# Patient Record
Sex: Female | Born: 1965 | Smoking: Never smoker
Health system: Southern US, Community
[De-identification: ages and names within clinical notes are randomized; demographics above are authoritative.]

## PROBLEM LIST (undated history)

## (undated) DIAGNOSIS — G4730 Sleep apnea, unspecified: Secondary | ICD-10-CM

## (undated) DIAGNOSIS — Z8489 Family history of other specified conditions: Secondary | ICD-10-CM

## (undated) DIAGNOSIS — R0600 Dyspnea, unspecified: Secondary | ICD-10-CM

## (undated) DIAGNOSIS — J45909 Unspecified asthma, uncomplicated: Secondary | ICD-10-CM

## (undated) DIAGNOSIS — K219 Gastro-esophageal reflux disease without esophagitis: Secondary | ICD-10-CM

## (undated) DIAGNOSIS — J189 Pneumonia, unspecified organism: Secondary | ICD-10-CM

## (undated) DIAGNOSIS — L4050 Arthropathic psoriasis, unspecified: Secondary | ICD-10-CM

## (undated) DIAGNOSIS — I219 Acute myocardial infarction, unspecified: Secondary | ICD-10-CM

## (undated) HISTORY — PX: CHOLECYSTECTOMY: SHX55

## (undated) HISTORY — PX: DIAGNOSTIC LAPAROSCOPY: SUR761

## (undated) HISTORY — PX: COLONOSCOPY: SHX174

## (undated) HISTORY — PX: ABDOMINAL HYSTERECTOMY: SHX81

---

## 2020-07-12 ENCOUNTER — Other Ambulatory Visit: Payer: Self-pay | Admitting: Otolaryngology

## 2020-08-16 ENCOUNTER — Encounter (HOSPITAL_BASED_OUTPATIENT_CLINIC_OR_DEPARTMENT_OTHER): Payer: Self-pay | Admitting: Otolaryngology

## 2020-08-16 ENCOUNTER — Other Ambulatory Visit: Payer: Self-pay

## 2020-08-19 ENCOUNTER — Other Ambulatory Visit (HOSPITAL_COMMUNITY): Payer: Self-pay

## 2020-08-20 ENCOUNTER — Other Ambulatory Visit (HOSPITAL_COMMUNITY)
Admission: RE | Admit: 2020-08-20 | Discharge: 2020-08-20 | Disposition: A | Discharge status: Home / Self Care | Payer: BC Managed Care – PPO | Source: Ambulatory Visit | Attending: Otolaryngology | Admitting: Otolaryngology

## 2020-08-20 DIAGNOSIS — Z20822 Contact with and (suspected) exposure to covid-19: Secondary | ICD-10-CM | POA: Diagnosis not present

## 2020-08-20 DIAGNOSIS — Z01812 Encounter for preprocedural laboratory examination: Secondary | ICD-10-CM | POA: Diagnosis present

## 2020-08-21 LAB — SARS CORONAVIRUS 2 (TAT 6-24 HRS): SARS Coronavirus 2: NEGATIVE

## 2020-08-23 ENCOUNTER — Ambulatory Visit (HOSPITAL_BASED_OUTPATIENT_CLINIC_OR_DEPARTMENT_OTHER): Payer: BC Managed Care – PPO | Admitting: Certified Registered"

## 2020-08-23 ENCOUNTER — Other Ambulatory Visit: Payer: Self-pay

## 2020-08-23 ENCOUNTER — Encounter (HOSPITAL_BASED_OUTPATIENT_CLINIC_OR_DEPARTMENT_OTHER): Payer: Self-pay | Admitting: Otolaryngology

## 2020-08-23 ENCOUNTER — Ambulatory Visit (HOSPITAL_BASED_OUTPATIENT_CLINIC_OR_DEPARTMENT_OTHER)
Admission: RE | Admit: 2020-08-23 | Discharge: 2020-08-23 | Disposition: A | Discharge status: Home / Self Care | Payer: BC Managed Care – PPO | Attending: Otolaryngology | Admitting: Otolaryngology

## 2020-08-23 ENCOUNTER — Encounter (HOSPITAL_BASED_OUTPATIENT_CLINIC_OR_DEPARTMENT_OTHER)
Admission: RE | Disposition: A | Discharge status: Home / Self Care | Payer: Self-pay | Source: Home / Self Care | Attending: Otolaryngology

## 2020-08-23 DIAGNOSIS — G4733 Obstructive sleep apnea (adult) (pediatric): Secondary | ICD-10-CM | POA: Diagnosis present

## 2020-08-23 DIAGNOSIS — Z881 Allergy status to other antibiotic agents status: Secondary | ICD-10-CM | POA: Insufficient documentation

## 2020-08-23 DIAGNOSIS — Z79899 Other long term (current) drug therapy: Secondary | ICD-10-CM | POA: Diagnosis not present

## 2020-08-23 DIAGNOSIS — Z7951 Long term (current) use of inhaled steroids: Secondary | ICD-10-CM | POA: Diagnosis not present

## 2020-08-23 HISTORY — DX: Unspecified asthma, uncomplicated: J45.909

## 2020-08-23 HISTORY — DX: Gastro-esophageal reflux disease without esophagitis: K21.9

## 2020-08-23 HISTORY — PX: DRUG INDUCED ENDOSCOPY: SHX6808

## 2020-08-23 HISTORY — DX: Arthropathic psoriasis, unspecified: L40.50

## 2020-08-23 HISTORY — DX: Sleep apnea, unspecified: G47.30

## 2020-08-23 SURGERY — DRUG INDUCED SLEEP ENDOSCOPY
Anesthesia: Monitor Anesthesia Care | Site: Mouth

## 2020-08-23 MED ORDER — PROPOFOL 500 MG/50ML IV EMUL
INTRAVENOUS | Status: DC | PRN
  Administered 2020-08-23: 35 ug/kg/min via INTRAVENOUS

## 2020-08-23 MED ORDER — LIDOCAINE 2% (20 MG/ML) 5 ML SYRINGE
INTRAMUSCULAR | Status: DC | PRN
  Administered 2020-08-23: 60 mg via INTRAVENOUS

## 2020-08-23 MED ORDER — LACTATED RINGERS IV SOLN: INTRAVENOUS | Status: DC

## 2020-08-23 MED ORDER — LIDOCAINE-EPINEPHRINE 1 %-1:100000 IJ SOLN
INTRAMUSCULAR | Status: AC
  Filled 2020-08-23: qty 1

## 2020-08-23 MED ORDER — ONDANSETRON HCL 4 MG/2ML IJ SOLN
INTRAMUSCULAR | Status: DC | PRN
  Administered 2020-08-23: 4 mg via INTRAVENOUS

## 2020-08-23 MED ORDER — ACETAMINOPHEN 500 MG PO TABS: 1000.0000 mg | ORAL_TABLET | Freq: Once | ORAL | Status: DC

## 2020-08-23 MED ORDER — FENTANYL CITRATE (PF) 100 MCG/2ML IJ SOLN: 25.0000 ug | INTRAMUSCULAR | Status: DC | PRN

## 2020-08-23 MED ORDER — OXYMETAZOLINE HCL 0.05 % NA SOLN
NASAL | Status: AC
  Filled 2020-08-23: qty 30

## 2020-08-23 SURGICAL SUPPLY — 15 items
CANISTER SUCT 1200ML W/VALVE (MISCELLANEOUS) IMPLANT
COVER WAND RF STERILE (DRAPES) IMPLANT
GLOVE BIOGEL M 7.0 STRL (GLOVE) ×3 IMPLANT
KIT CLEAN ENDO (MISCELLANEOUS) ×3 IMPLANT
NEEDLE PRECISIONGLIDE 27X1.5 (NEEDLE) IMPLANT
PACK BASIN DAY SURGERY FS (CUSTOM PROCEDURE TRAY) ×3 IMPLANT
PATTIES SURGICAL .5 X3 (DISPOSABLE) IMPLANT
SHEET MEDIUM DRAPE 40X70 STRL (DRAPES) ×3 IMPLANT
SOL ANTI FOG 6CC (MISCELLANEOUS) ×1 IMPLANT
SOLUTION ANTI FOG 6CC (MISCELLANEOUS) ×2
SPONGE NEURO XRAY DETECT 1X3 (DISPOSABLE) IMPLANT
SYR CONTROL 10ML LL (SYRINGE) IMPLANT
TOWEL GREEN STERILE FF (TOWEL DISPOSABLE) ×3 IMPLANT
TUBE CONNECTING 20'X1/4 (TUBING)
TUBE CONNECTING 20X1/4 (TUBING) IMPLANT

## 2020-08-23 NOTE — Op Note (Signed)
Operative Note: DRUG INDUCED SLEEP ENDOSCOPY  Patient: Jenna Baird  Medical record number: [id number]  Date:08/23/2020  Pre-operative Indications: 1.  Obstructive Sleep Apnea  Postoperative Indications: Same  Surgical Procedure: 1.  Drug Induced Sleep Endoscopy (DISE)  Anesthesia: MAC with IV sedation  Surgeon: Delsa Bern, M.D.  Complications: None  EBL: None  Findings: There is no evidence of complete concentric palatal obstruction.  Anatomically the patient should be a candidate for hypoglossal nerve stimulation therapy.     Brief History: The patient is a 54 y.o. female with a history of obstructive sleep apnea. The patient has undergone previous sleep study which showed mild to moderate levels of obstructive sleep apnea.  The patient was prescribed CPAP which they consistently attempted to use without success.  Given the patient's history and findings, the above drug-induced sleep endoscopy was recommended to assess the patient's anatomic level of apnea.  Risks and benefits were discussed in detail with the patient today understand and agree with our plan for surgery which is scheduled at [place] under sedated anesthesia as an outpatient.  Surgical Procedure: The patient is brought to the operating room on 08/23/2020 and placed in supine position on the operating table.  Intravenous sedated anesthesia was established without difficulty using the standard drug-induced sleep endoscopy protocol. When the patient was adequately anesthetized, surgical timeout was performed and correct identification of the patient and the surgical procedure.    A propofol infusion was administered and the patient was monitored carefully to achieve a level of sedation appropriate for DISE.  The patient did not respond to verbal commands but still had spontaneous respiration, sleep disordered breathing and associated desaturations were observed.  With the patient under adequate sedated anesthesia  the flexible nasal laryngoscope was passed without difficulty.  The patient's nasal cavity was patent without evidence of significant septal deviation, mass, polyp or obstruction.  The endoscope was then passed to visualize the velopharynx, oropharynx, tongue base and epiglottis to assess areas of obstruction.  Patient's airway showed anterior to posterior palatal and tongue base collapse.  Normal-appearing supraglottis and larynx..   There was no evidence of complete concentric palatal obstruction and the patient appeared to be a candidate anatomically for hypoglossal nerve stimulation therapy.  Surgical sponge count was correct. Patient was awakened from anesthetic and transferred from the operating room to the recovery room in stable condition. There were no complications and no blood loss.   Delsa Bern, M.D. [facility] 08/23/2020

## 2020-08-23 NOTE — Anesthesia Procedure Notes (Signed)
Procedure Name: MAC Date/Time: 08/23/2020 7:58 AM Performed by: Signe Colt, CRNA Pre-anesthesia Checklist: Patient identified, Emergency Drugs available, Suction available, Patient being monitored and Timeout performed Patient Re-evaluated:Patient Re-evaluated prior to induction Oxygen Delivery Method: Simple face mask

## 2020-08-23 NOTE — Transfer of Care (Signed)
Immediate Anesthesia Transfer of Care Note  Patient: Jenna Baird  Procedure(s) Performed: DRUG INDUCED ENDOSCOPY (N/A Mouth)  Patient Location: PACU  Anesthesia Type:MAC  Level of Consciousness: awake, alert , oriented and patient cooperative  Airway & Oxygen Therapy: Patient Spontanous Breathing and Patient connected to face mask oxygen  Post-op Assessment: Report given to RN and Post -op Vital signs reviewed and stable  Post vital signs: Reviewed and stable  Last Vitals:  Vitals Value Taken Time  BP 119/78 08/23/20 0802  Temp    Pulse 68 08/23/20 0803  Resp 23 08/23/20 0803  SpO2 100 % 08/23/20 0803  Vitals shown include unvalidated device data.  Last Pain:  Vitals:   08/23/20 0636  TempSrc: Oral  PainSc: 0-No pain         Complications: No complications documented.

## 2020-08-23 NOTE — Anesthesia Postprocedure Evaluation (Signed)
Anesthesia Post Note  Patient: Jenna Baird  Procedure(s) Performed: DRUG INDUCED ENDOSCOPY (N/A Mouth)     Patient location during evaluation: PACU Anesthesia Type: MAC Level of consciousness: awake and alert Pain management: pain level controlled Vital Signs Assessment: post-procedure vital signs reviewed and stable Respiratory status: spontaneous breathing, nonlabored ventilation, respiratory function stable and patient connected to nasal cannula oxygen Cardiovascular status: stable and blood pressure returned to baseline Postop Assessment: no apparent nausea or vomiting Anesthetic complications: no   No complications documented.  Last Vitals:  Vitals:   08/23/20 0801 08/23/20 0836  BP: 119/78 133/71  Pulse: 74 65  Resp: (!) 23 18  Temp: 36.7 C 36.7 C  SpO2: 99% 100%    Last Pain:  Vitals:   08/23/20 0836  TempSrc:   PainSc: 0-No pain                 Tiajuana Amass

## 2020-08-23 NOTE — Discharge Instructions (Signed)

## 2020-08-23 NOTE — Anesthesia Preprocedure Evaluation (Signed)
Anesthesia Evaluation  Patient identified by MRN, date of birth, ID band Patient awake    Reviewed: Allergy & Precautions, NPO status , Patient's Chart, lab work & pertinent test results  Airway Mallampati: II  TM Distance: >3 FB Neck ROM: Full    Dental  (+) Dental Advisory Given   Pulmonary asthma , sleep apnea ,    breath sounds clear to auscultation       Cardiovascular negative cardio ROS   Rhythm:Regular Rate:Normal     Neuro/Psych negative neurological ROS     GI/Hepatic Neg liver ROS, GERD  ,  Endo/Other  negative endocrine ROS  Renal/GU negative Renal ROS     Musculoskeletal  (+) Arthritis ,   Abdominal   Peds  Hematology negative hematology ROS (+)   Anesthesia Other Findings   Reproductive/Obstetrics                             Anesthesia Physical Anesthesia Plan  ASA: II  Anesthesia Plan: MAC   Post-op Pain Management:    Induction:   PONV Risk Score and Plan: 2 and Propofol infusion, Ondansetron and Treatment may vary due to age or medical condition  Airway Management Planned: Natural Airway, Simple Face Mask and Mask  Additional Equipment:   Intra-op Plan:   Post-operative Plan:   Informed Consent: I have reviewed the patients History and Physical, chart, labs and discussed the procedure including the risks, benefits and alternatives for the proposed anesthesia with the patient or authorized representative who has indicated his/her understanding and acceptance.     Dental advisory given  Plan Discussed with: CRNA  Anesthesia Plan Comments:         Anesthesia Quick Evaluation

## 2020-08-23 NOTE — H&P (Signed)
Shams Fill is an 54 y.o. female.   Chief Complaint: OSA HPI: hx of OSA unable to tol CPAP  Past Medical History:  Diagnosis Date  . Asthma   . GERD (gastroesophageal reflux disease)   . Psoriatic arthritis (HCC)   . Sleep apnea    does not use CPAP    Past Surgical History:  Procedure Laterality Date  . ABDOMINAL HYSTERECTOMY    . CHOLECYSTECTOMY    . COLONOSCOPY    . DIAGNOSTIC LAPAROSCOPY      History reviewed. No pertinent family history. Social History:  reports that she has never smoked. She has never used smokeless tobacco. She reports current alcohol use. She reports that she does not use drugs.  Allergies:  Allergies  Allergen Reactions  . Vancomycin Hives    Medications Prior to Admission  Medication Sig Dispense Refill  . Esomeprazole Magnesium (NEXIUM 24HR PO) Take by mouth.    . etanercept (ENBREL SURECLICK) 50 MG/ML injection Inject 50 mg into the skin once a week.    . fluticasone furoate-vilanterol (BREO ELLIPTA) 200-25 MCG/INH AEPB Inhale 1 puff into the lungs daily.    Marland Kitchen levalbuterol (XOPENEX HFA) 45 MCG/ACT inhaler Inhale 1 puff into the lungs every 6 (six) hours as needed for wheezing.    . montelukast (SINGULAIR) 10 MG tablet Take 10 mg by mouth at bedtime.    . Multiple Vitamin (MULTIVITAMIN WITH MINERALS) TABS tablet Take 1 tablet by mouth daily.      No results found for this or any previous visit (from the past 48 hour(s)). No results found.  Review of Systems  Constitutional: Negative.   HENT: Negative.   Respiratory: Positive for apnea.     Pulse 70, temperature 98 F (36.7 C), temperature source Oral, resp. rate 16, height 5\' 4"  (1.626 m), weight 82.8 kg, SpO2 99 %. Physical Exam Constitutional:      Appearance: She is normal weight.  Cardiovascular:     Rate and Rhythm: Normal rate.  Pulmonary:     Effort: Pulmonary effort is normal.  Neurological:     Mental Status: She is alert.      Assessment/Plan Adm for OP drug  induced sleep endoscopy  , MD 08/23/2020, 7:40 AM

## 2020-08-24 ENCOUNTER — Encounter (HOSPITAL_BASED_OUTPATIENT_CLINIC_OR_DEPARTMENT_OTHER): Payer: Self-pay | Admitting: Otolaryngology

## 2020-11-18 ENCOUNTER — Other Ambulatory Visit: Payer: Self-pay | Admitting: Otolaryngology

## 2021-01-12 ENCOUNTER — Other Ambulatory Visit: Payer: Self-pay

## 2021-01-12 ENCOUNTER — Encounter (HOSPITAL_COMMUNITY): Payer: Self-pay

## 2021-01-12 ENCOUNTER — Encounter (HOSPITAL_COMMUNITY)
Admission: RE | Admit: 2021-01-12 | Discharge: 2021-01-12 | Disposition: A | Discharge status: Home / Self Care | Payer: BC Managed Care – PPO | Source: Ambulatory Visit | Attending: Otolaryngology | Admitting: Otolaryngology

## 2021-01-12 DIAGNOSIS — Z20822 Contact with and (suspected) exposure to covid-19: Secondary | ICD-10-CM | POA: Insufficient documentation

## 2021-01-12 DIAGNOSIS — E663 Overweight: Secondary | ICD-10-CM | POA: Diagnosis not present

## 2021-01-12 DIAGNOSIS — G4733 Obstructive sleep apnea (adult) (pediatric): Secondary | ICD-10-CM | POA: Diagnosis not present

## 2021-01-12 DIAGNOSIS — Z6830 Body mass index (BMI) 30.0-30.9, adult: Secondary | ICD-10-CM | POA: Diagnosis not present

## 2021-01-12 DIAGNOSIS — Z01812 Encounter for preprocedural laboratory examination: Secondary | ICD-10-CM | POA: Diagnosis not present

## 2021-01-12 HISTORY — DX: Dyspnea, unspecified: R06.00

## 2021-01-12 HISTORY — DX: Acute myocardial infarction, unspecified: I21.9

## 2021-01-12 HISTORY — DX: Family history of other specified conditions: Z84.89

## 2021-01-12 HISTORY — DX: Pneumonia, unspecified organism: J18.9

## 2021-01-12 LAB — CBC
HCT: 43.3 % (ref 36.0–46.0)
Hemoglobin: 13.3 g/dL (ref 12.0–15.0)
MCH: 24.8 pg — ABNORMAL LOW (ref 26.0–34.0)
MCHC: 30.7 g/dL (ref 30.0–36.0)
MCV: 80.6 fL (ref 80.0–100.0)
Platelets: 327 10*3/uL (ref 150–400)
RBC: 5.37 MIL/uL — ABNORMAL HIGH (ref 3.87–5.11)
RDW: 14.3 % (ref 11.5–15.5)
WBC: 8.7 10*3/uL (ref 4.0–10.5)
nRBC: 0 % (ref 0.0–0.2)

## 2021-01-12 LAB — SARS CORONAVIRUS 2 (TAT 6-24 HRS): SARS Coronavirus 2: NEGATIVE

## 2021-01-12 NOTE — Progress Notes (Signed)
Surgical Instructions   Your procedure is scheduled on Friday, March 25th.  Report to [facility] Main Entrance "A" at 5:30 A.M., then check in with the Admitting office.  Call this number if you have problems the morning of surgery:  [phone]   If you have any questions prior to your surgery date call [phone]: Open Monday-Friday 8am-4pm   Remember:  Do not eat or drink after midnight the night before your surgery    Take these medicines the morning of surgery with A SIP OF WATER  esomeprazole (NEXIUM) loratadine (CLARITIN)   If needed: levalbuterol (XOPENEX HFA)/inhaler  As of today, STOP taking any Aspirin (unless otherwise instructed by your surgeon) Aleve, Naproxen, Ibuprofen, Motrin, Advil, Goody's, BC's, all herbal medications, fish oil, and all vitamins.                     Do not wear jewelry, make up, or nail polish            Do not wear lotions, powders, perfumes, or deodorant.            Do not shave 48 hours prior to surgery.              Do not bring valuables to the hospital.            [facility] is not responsible for any belongings or valuables.  Do NOT Smoke (Tobacco/Vaping) or drink Alcohol 24 hours prior to your procedure If you use a CPAP at night, you may bring all equipment for your overnight stay.   Contacts, glasses, dentures or bridgework may not be worn into surgery, please bring cases for these belongings   For patients admitted to the hospital, discharge time will be determined by your treatment team.   Patients discharged the day of surgery will not be allowed to drive home, and someone needs to stay with them for 24 hours.  Special instructions:   [place]- Preparing For Surgery  Before surgery, you can play an important role. Because skin is not sterile, your skin needs to be as free of germs as possible. You can reduce the number of germs on your skin by washing with CHG (chlorahexidine gluconate) Soap before surgery.  CHG is an  antiseptic cleaner which kills germs and bonds with the skin to continue killing germs even after washing.    Oral Hygiene is also important to reduce your risk of infection.  Remember - BRUSH YOUR TEETH THE MORNING OF SURGERY WITH YOUR REGULAR TOOTHPASTE  Please do not use if you have an allergy to CHG or antibacterial soaps. If your skin becomes reddened/irritated stop using the CHG.  Do not shave (including legs and underarms) for at least 48 hours prior to first CHG shower. It is OK to shave your face.  Please follow these instructions carefully.   1. Shower the NIGHT BEFORE SURGERY and the MORNING OF SURGERY  2. If you chose to wash your hair, wash your hair first as usual with your normal shampoo. After you shampoo, rinse your hair and body thoroughly to remove the shampoo.  3. Wash Face and genitals (private parts) with your normal soap.   4. Use CHG Soap as you would any other liquid soap. You can apply CHG directly to the skin and wash gently with a scrungie or a clean washcloth.   5. Apply the CHG Soap to your body ONLY FROM THE NECK DOWN.  Do not use on open  wounds or open sores. Avoid contact with your eyes, ears, mouth and genitals (private parts). Wash Face and genitals (private parts)  with your normal soap.   6. Wash thoroughly, paying special attention to the area where your surgery will be performed.  7. Thoroughly rinse your body with warm water from the neck down.  8. DO NOT shower/wash with your normal soap after using and rinsing off the CHG Soap.  9. Pat yourself dry with a CLEAN TOWEL.  10. Wear CLEAN PAJAMAS to bed the night before surgery  11. Place CLEAN SHEETS on your bed the night before your surgery  12. DO NOT SLEEP WITH PETS.  Day of Surgery: Shower with CHG soap Wear Clean/Comfortable clothing the morning of surgery Do not apply any deodorants/lotions.   Remember to brush your teeth WITH YOUR REGULAR TOOTHPASTE.   Please read over the following  fact sheets that you were given.

## 2021-01-12 NOTE — Progress Notes (Signed)
PCP - Karsten Fells Cardiologist - Dr. Koleen Distance - ED visit  PPM/ICD - denies Device Orders - denies Rep Notified - denies  Chest x-ray - n/a EKG - 09/06/2020 - tracing requested Stress Test - denies ECHO - 09/17/2020 Cardiac Cath - denies   Sleep Study - yes, positive CPAP - not used Diabetes - denies  Blood Thinner Instructions: n/a Aspirin Instructions:n/a  ERAS Protcol - no  COVID TEST- 01/12/2021  Anesthesia review: yes, EKG tracing requested  Patient denies shortness of breath, fever, cough and chest pain at PAT appointment   All instructions explained to the patient, with a verbal understanding of the material. Patient agrees to go over the instructions while at home for a better understanding. Patient also instructed to self quarantine after being tested for COVID-19. The opportunity to ask questions was provided.   Coronavirus Screening  Have you experienced the following symptoms:  Cough yes/no: No Fever (>100.25F)  yes/no: No Runny nose yes/no: No Sore throat yes/no: No Difficulty breathing/shortness of breath  yes/no: No  Have you or a family member traveled in the last 14 days and where? yes/no: No   If the patient indicates "YES" to the above questions, their PAT will be rescheduled to limit the exposure to others and, the surgeon will be notified. THE PATIENT WILL NEED TO BE ASYMPTOMATIC FOR 14 DAYS.   If the patient is not experiencing any of these symptoms, the PAT nurse will instruct them to NOT bring anyone with them to their appointment since they may have these symptoms or traveled as well.   Please remind your patients and families that hospital visitation restrictions are in effect and the importance of the restrictions.

## 2021-01-13 ENCOUNTER — Encounter (HOSPITAL_COMMUNITY): Payer: Self-pay

## 2021-01-13 NOTE — Anesthesia Preprocedure Evaluation (Addendum)
Anesthesia Evaluation  Patient identified by MRN, date of birth, ID band Patient awake    Reviewed: Allergy & Precautions, NPO status , Patient's Chart, lab work & pertinent test results  Airway Mallampati: I       Dental no notable dental hx.    Pulmonary asthma , sleep apnea ,    Pulmonary exam normal        Cardiovascular negative cardio ROS Normal cardiovascular exam     Neuro/Psych negative neurological ROS  negative psych ROS   GI/Hepatic Neg liver ROS, GERD  Medicated,  Endo/Other  negative endocrine ROS  Renal/GU negative Renal ROS     Musculoskeletal   Abdominal Normal abdominal exam  (+)   Peds  Hematology negative hematology ROS (+)   Anesthesia Other Findings  Patient was seen at [facility] ED on 09/06/2020 for episode of atypical chest pain and presyncope.  Work-up was benign, ACS was ruled out.  She was advised to follow-up outpatient with cardiology.  She was seen by [facility] health cardiology on 09/09/2019 Per note, the patient did describe a history of palpitations and a 14-day Holter monitor and echo were ordered.  She was advised to follow-up with PCP and cardiology as needed.  Echo showed systolic function to be hyperdynamic with EF >70%.  No significant valvular abnormalities. No significant change from prior echo.  Holter monitor showed predominant sinus rhythm with average heart rate of 83 bpm.  Rare supraventricular ectopy consisted of one atrial triplet.  No ventricular ectopy was identified.  No conduction abnormalities or significant pauses.   Reproductive/Obstetrics                            Anesthesia Physical Anesthesia Plan  ASA: II  Anesthesia Plan: General   Post-op Pain Management:    Induction: Intravenous  PONV Risk Score and Plan: 4 or greater and Ondansetron, Dexamethasone and Midazolam  Airway Management Planned: Oral ETT  Additional Equipment:  None  Intra-op Plan:   Post-operative Plan: Extubation in OR  Informed Consent: I have reviewed the patients History and Physical, chart, labs and discussed the procedure including the risks, benefits and alternatives for the proposed anesthesia with the patient or authorized representative who has indicated his/her understanding and acceptance.     Dental advisory given  Plan Discussed with: CRNA  Anesthesia Plan Comments: (PAT note by Antionette Poles, PA-C: Patient was seen at [facility] ED on 09/06/2020 for episode of atypical chest pain and presyncope.  Work-up was benign, ACS was ruled out.  She was advised to follow-up outpatient with cardiology.  She was seen by [facility] health cardiology on 09/09/2019 Per note, the patient did describe a history of palpitations and a 14-day Holter monitor and echo were ordered.  She was advised to follow-up with PCP and cardiology as needed.  Echo showed systolic function to be hyperdynamic with EF >70%.  No significant valvular abnormalities. No significant change from prior echo.  Holter monitor showed predominant sinus rhythm with average heart rate of 83 bpm.  Rare supraventricular ectopy consisted of one atrial triplet.  No ventricular ectopy was identified.  No conduction abnormalities or significant pauses.  Follows with rheumatology at [facility] for history of psoriatic arthritis.  Follows with pulmonology at [facility] for history of moderate persistent asthma, OSA intolerant to CPAP.  She was previously maintained on Breo Ellipta.  When she was last seen 07/05/2020 her spirometry was normal which was significant improvement compared to  previous.  Per note, "Spirometry is normal now with an FVC of 2.71 L 79 FEV1 2.44 L 89%, FEV1/FVC 90% which is markedly improved on comparison to previous study." At that time it was advised to wean off Breo if tolerated.  She was to continue on Singulair, Claritin, as needed albuterol.  She was instructed to restart Breo if  needed.  Preop labs reviewed, unremarkable.  EKG 09/06/2020 (copy on chart): NSR.  Rate 84.  Normal axis.  14-day event monitor 10/05/2020 (Care Everywhere): Predominant rhythm was sinus with an average heart rate of 83 bpm. Maximum sinus rate was sinus tachycardia at 120 bpm occurring at 9:48 AM on day 11. Minimum heart was sinus bradycardia at 55 bpm occurring at 3:32 AM on day 4. There were no conduction abnormalities or significant pauses.   Rare supraventricular ectopy consisted one atrial triplet. No ventricular ectopy was identified.   TTE 09/17/2020 (Care Everywhere): LeftVentricle: Systolic function is hyperdynamic. EF: >70%.   Hyperdynamic LV systolic function. No significant valvular abnormalities.  No significant change from prior echo.  CTA head/neck 09/06/2020 (Care Everywhere): IMPRESSION:  Normal CT angiogram of the head and neck. No stenosis, large vessel occlusion,or aneurysm.   Portable CXR 09/06/2020 (Care Everywhere): Compare: 02/04/2020   INDICATION:Chest pain   FINDINGS: Lungs are clear. Cardiomediastinal contours are normal. There is no evidence of adenopathy or pneumothorax. No significant bony abnormalities are present.   IMPRESSION: No evidence of active disease in the chest. )     Anesthesia Quick Evaluation

## 2021-01-13 NOTE — Progress Notes (Signed)
Anesthesia Chart Review:  Patient was seen at [facility] ED on 09/06/2020 for episode of atypical chest pain and presyncope.  Work-up was benign, ACS was ruled out.  She was advised to follow-up outpatient with cardiology.  She was seen by [facility] health cardiology on 09/09/2019 Per note, the patient did describe a history of palpitations and a 14-day Holter monitor and echo were ordered.  She was advised to follow-up with PCP and cardiology as needed.  Echo showed systolic function to be hyperdynamic with EF >70%.  No significant valvular abnormalities. No significant change from prior echo.  Holter monitor showed predominant sinus rhythm with average heart rate of 83 bpm.  Rare supraventricular ectopy consisted of one atrial triplet.  No ventricular ectopy was identified.  No conduction abnormalities or significant pauses.  Follows with rheumatology at [facility] for history of psoriatic arthritis.  Follows with pulmonology at [facility] for history of moderate persistent asthma, OSA intolerant to CPAP.  She was previously maintained on Breo Ellipta.  When she was last seen 07/05/2020 her spirometry was normal which was significant improvement compared to previous.  Per note, "Spirometry is normal now with an FVC of 2.71 L 79 FEV1 2.44 L 89%, FEV1/FVC 90% which is markedly improved on comparison to previous study." At that time it was advised to wean off Breo if tolerated.  She was to continue on Singulair, Claritin, as needed albuterol.  She was instructed to restart Breo if needed.  Preop labs reviewed, unremarkable.  EKG 09/06/2020 (copy on chart): NSR.  Rate 84.  Normal axis.  14-day event monitor 10/05/2020 (Care Everywhere): Predominant rhythm was sinus with an average heart rate of 83 bpm. Maximum sinus rate was sinus tachycardia at 120 bpm occurring at 9:48 AM on day 11. Minimum heart was sinus bradycardia at 55 bpm occurring at 3:32 AM on day 4. There were no conduction abnormalities or significant  pauses.   Rare supraventricular ectopy consisted one atrial triplet. No ventricular ectopy was identified.   TTE 09/17/2020 (Care Everywhere): LeftVentricle: Systolic function is hyperdynamic. EF: >70%.   Hyperdynamic LV systolic function. No significant valvular abnormalities.  No significant change from prior echo.  CTA head/neck 09/06/2020 (Care Everywhere): IMPRESSION:  Normal CT angiogram of the head and neck. No stenosis, large vessel occlusion,or aneurysm.   Portable CXR 09/06/2020 (Care Everywhere): Compare: 02/04/2020   INDICATION:Chest pain   FINDINGS: Lungs are clear. Cardiomediastinal contours are normal. There is no evidence of adenopathy or pneumothorax. No significant bony abnormalities are present.   IMPRESSION: No evidence of active disease in the chest.    Zannie Cove [facility] Short Stay Center/Anesthesiology Phone [phone] 01/13/2021 10:00 AM

## 2021-01-14 ENCOUNTER — Ambulatory Visit (HOSPITAL_COMMUNITY): Payer: BC Managed Care – PPO

## 2021-01-14 ENCOUNTER — Ambulatory Visit (HOSPITAL_COMMUNITY): Payer: BC Managed Care – PPO | Admitting: Physician Assistant

## 2021-01-14 ENCOUNTER — Other Ambulatory Visit: Payer: Self-pay

## 2021-01-14 ENCOUNTER — Ambulatory Visit (HOSPITAL_COMMUNITY)
Admission: RE | Admit: 2021-01-14 | Discharge: 2021-01-14 | Disposition: A | Discharge status: Home / Self Care | Payer: BC Managed Care – PPO | Attending: Otolaryngology | Admitting: Otolaryngology

## 2021-01-14 ENCOUNTER — Encounter (HOSPITAL_COMMUNITY): Payer: Self-pay | Admitting: Otolaryngology

## 2021-01-14 ENCOUNTER — Encounter (HOSPITAL_COMMUNITY)
Admission: RE | Disposition: A | Discharge status: Home / Self Care | Payer: Self-pay | Source: Home / Self Care | Attending: Otolaryngology

## 2021-01-14 ENCOUNTER — Ambulatory Visit (HOSPITAL_COMMUNITY): Payer: BC Managed Care – PPO | Admitting: Certified Registered Nurse Anesthetist

## 2021-01-14 DIAGNOSIS — G4733 Obstructive sleep apnea (adult) (pediatric): Secondary | ICD-10-CM | POA: Diagnosis not present

## 2021-01-14 DIAGNOSIS — E663 Overweight: Secondary | ICD-10-CM | POA: Diagnosis not present

## 2021-01-14 DIAGNOSIS — Z6830 Body mass index (BMI) 30.0-30.9, adult: Secondary | ICD-10-CM | POA: Diagnosis not present

## 2021-01-14 DIAGNOSIS — Z09 Encounter for follow-up examination after completed treatment for conditions other than malignant neoplasm: Secondary | ICD-10-CM

## 2021-01-14 HISTORY — PX: IMPLANTATION OF HYPOGLOSSAL NERVE STIMULATOR: SHX6827

## 2021-01-14 SURGERY — INSERTION, HYPOGLOSSAL NERVE STIMULATOR
Anesthesia: General | Site: Neck | Laterality: Right

## 2021-01-14 MED ORDER — HYDROCODONE-ACETAMINOPHEN 5-325 MG PO TABS: ORAL_TABLET | ORAL | 0 refills | Status: AC

## 2021-01-14 MED ORDER — ONDANSETRON HCL 4 MG/2ML IJ SOLN
INTRAMUSCULAR | Status: AC
  Filled 2021-01-14: qty 2

## 2021-01-14 MED ORDER — MEPERIDINE HCL 25 MG/ML IJ SOLN: 6.2500 mg | INTRAMUSCULAR | Status: DC | PRN

## 2021-01-14 MED ORDER — PROPOFOL 10 MG/ML IV BOLUS
INTRAVENOUS | Status: DC | PRN
  Administered 2021-01-14: 170 mg via INTRAVENOUS
  Administered 2021-01-14: 30 mg via INTRAVENOUS
  Administered 2021-01-14: 50 mg via INTRAVENOUS
  Administered 2021-01-14: 60 mg via INTRAVENOUS

## 2021-01-14 MED ORDER — SUCCINYLCHOLINE CHLORIDE 200 MG/10ML IV SOSY
PREFILLED_SYRINGE | INTRAVENOUS | Status: DC | PRN
  Administered 2021-01-14: 140 mg via INTRAVENOUS

## 2021-01-14 MED ORDER — OXYCODONE HCL 5 MG PO TABS
ORAL_TABLET | ORAL | Status: AC
  Filled 2021-01-14: qty 1

## 2021-01-14 MED ORDER — FENTANYL CITRATE (PF) 100 MCG/2ML IJ SOLN
25.0000 ug | INTRAMUSCULAR | Status: DC | PRN
  Administered 2021-01-14 (×2): 50 ug via INTRAVENOUS

## 2021-01-14 MED ORDER — LIDOCAINE 2% (20 MG/ML) 5 ML SYRINGE
INTRAMUSCULAR | Status: AC
  Filled 2021-01-14: qty 5

## 2021-01-14 MED ORDER — PROPOFOL 10 MG/ML IV BOLUS
INTRAVENOUS | Status: AC
  Filled 2021-01-14: qty 40

## 2021-01-14 MED ORDER — ONDANSETRON HCL 4 MG/2ML IJ SOLN
INTRAMUSCULAR | Status: DC | PRN
  Administered 2021-01-14: 4 mg via INTRAVENOUS

## 2021-01-14 MED ORDER — CHLORHEXIDINE GLUCONATE 0.12 % MT SOLN: 15.0000 mL | Freq: Once | OROMUCOSAL | Status: AC

## 2021-01-14 MED ORDER — OXYCODONE HCL 5 MG/5ML PO SOLN: 5.0000 mg | Freq: Once | ORAL | Status: AC | PRN

## 2021-01-14 MED ORDER — SUCCINYLCHOLINE CHLORIDE 200 MG/10ML IV SOSY
PREFILLED_SYRINGE | INTRAVENOUS | Status: AC
  Filled 2021-01-14: qty 10

## 2021-01-14 MED ORDER — OXYCODONE HCL 5 MG PO TABS
5.0000 mg | ORAL_TABLET | Freq: Once | ORAL | Status: AC | PRN
  Administered 2021-01-14: 5 mg via ORAL

## 2021-01-14 MED ORDER — FENTANYL CITRATE (PF) 250 MCG/5ML IJ SOLN
INTRAMUSCULAR | Status: DC | PRN
  Administered 2021-01-14 (×2): 50 ug via INTRAVENOUS
  Administered 2021-01-14: 100 ug via INTRAVENOUS
  Administered 2021-01-14: 50 ug via INTRAVENOUS

## 2021-01-14 MED ORDER — DEXAMETHASONE SODIUM PHOSPHATE 10 MG/ML IJ SOLN
INTRAMUSCULAR | Status: AC
  Filled 2021-01-14: qty 1

## 2021-01-14 MED ORDER — LIDOCAINE 2% (20 MG/ML) 5 ML SYRINGE
INTRAMUSCULAR | Status: DC | PRN
  Administered 2021-01-14: 100 mg via INTRAVENOUS

## 2021-01-14 MED ORDER — CEFAZOLIN SODIUM-DEXTROSE 2-4 GM/100ML-% IV SOLN
2.0000 g | INTRAVENOUS | Status: AC
  Administered 2021-01-14: 2 g via INTRAVENOUS

## 2021-01-14 MED ORDER — LIDOCAINE-EPINEPHRINE 1 %-1:100000 IJ SOLN
INTRAMUSCULAR | Status: DC | PRN
  Administered 2021-01-14: 7 mL

## 2021-01-14 MED ORDER — 0.9 % SODIUM CHLORIDE (POUR BTL) OPTIME
TOPICAL | Status: DC | PRN
  Administered 2021-01-14: 1000 mL

## 2021-01-14 MED ORDER — ACETAMINOPHEN 10 MG/ML IV SOLN
INTRAVENOUS | Status: AC
  Filled 2021-01-14: qty 100

## 2021-01-14 MED ORDER — LIDOCAINE-EPINEPHRINE 1 %-1:100000 IJ SOLN
INTRAMUSCULAR | Status: AC
  Filled 2021-01-14: qty 1

## 2021-01-14 MED ORDER — ACETAMINOPHEN 325 MG PO TABS: 325.0000 mg | ORAL_TABLET | ORAL | Status: DC | PRN

## 2021-01-14 MED ORDER — PHENYLEPHRINE HCL-NACL 10-0.9 MG/250ML-% IV SOLN
INTRAVENOUS | Status: DC | PRN
  Administered 2021-01-14: 25 ug/min via INTRAVENOUS

## 2021-01-14 MED ORDER — CHLORHEXIDINE GLUCONATE 0.12 % MT SOLN
OROMUCOSAL | Status: AC
  Administered 2021-01-14: 15 mL via OROMUCOSAL
  Filled 2021-01-14: qty 15

## 2021-01-14 MED ORDER — ACETAMINOPHEN 10 MG/ML IV SOLN
1000.0000 mg | Freq: Once | INTRAVENOUS | Status: DC | PRN
  Administered 2021-01-14: 1000 mg via INTRAVENOUS

## 2021-01-14 MED ORDER — ORAL CARE MOUTH RINSE: 15.0000 mL | Freq: Once | OROMUCOSAL | Status: AC

## 2021-01-14 MED ORDER — FENTANYL CITRATE (PF) 100 MCG/2ML IJ SOLN
INTRAMUSCULAR | Status: AC
  Filled 2021-01-14: qty 2

## 2021-01-14 MED ORDER — FENTANYL CITRATE (PF) 250 MCG/5ML IJ SOLN
INTRAMUSCULAR | Status: AC
  Filled 2021-01-14: qty 5

## 2021-01-14 MED ORDER — ONDANSETRON HCL 4 MG/2ML IJ SOLN: 4.0000 mg | Freq: Once | INTRAMUSCULAR | Status: DC | PRN

## 2021-01-14 MED ORDER — SODIUM CHLORIDE 0.9 % IV SOLN
INTRAVENOUS | Status: DC
  Filled 2021-01-14: qty 10

## 2021-01-14 MED ORDER — DEXAMETHASONE SODIUM PHOSPHATE 10 MG/ML IJ SOLN
INTRAMUSCULAR | Status: DC | PRN
  Administered 2021-01-14: 10 mg via INTRAVENOUS

## 2021-01-14 MED ORDER — MIDAZOLAM HCL 2 MG/2ML IJ SOLN
INTRAMUSCULAR | Status: AC
  Filled 2021-01-14: qty 2

## 2021-01-14 MED ORDER — LACTATED RINGERS IV SOLN: INTRAVENOUS | Status: DC

## 2021-01-14 MED ORDER — CEFAZOLIN SODIUM-DEXTROSE 2-4 GM/100ML-% IV SOLN
INTRAVENOUS | Status: AC
  Filled 2021-01-14: qty 100

## 2021-01-14 MED ORDER — ACETAMINOPHEN 160 MG/5ML PO SOLN: 325.0000 mg | ORAL | Status: DC | PRN

## 2021-01-14 MED ORDER — HYDROMORPHONE HCL 1 MG/ML IJ SOLN: 0.5000 mg | INTRAMUSCULAR | Status: DC | PRN

## 2021-01-14 MED ORDER — MIDAZOLAM HCL 5 MG/5ML IJ SOLN
INTRAMUSCULAR | Status: DC | PRN
  Administered 2021-01-14: 2 mg via INTRAVENOUS

## 2021-01-14 SURGICAL SUPPLY — 66 items
BAG DECANTER FOR FLEXI CONT (MISCELLANEOUS) ×2 IMPLANT
BLADE CLIPPER SURG (BLADE) IMPLANT
BLADE SURG 15 STRL LF DISP TIS (BLADE) ×3 IMPLANT
BLADE SURG 15 STRL SS (BLADE) ×3
CANISTER SUCT 3000ML PPV (MISCELLANEOUS) ×2 IMPLANT
CORD BIPOLAR FORCEPS 12FT (ELECTRODE) ×4 IMPLANT
COVER PROBE W GEL 5X96 (DRAPES) ×2 IMPLANT
COVER SURGICAL LIGHT HANDLE (MISCELLANEOUS) ×2 IMPLANT
COVER WAND RF STERILE (DRAPES) ×2 IMPLANT
DERMABOND ADVANCED (GAUZE/BANDAGES/DRESSINGS) ×2
DERMABOND ADVANCED .7 DNX12 (GAUZE/BANDAGES/DRESSINGS) ×2 IMPLANT
DRAPE C-ARM 35X43 STRL (DRAPES) ×2 IMPLANT
DRAPE HEAD BAR (DRAPES) ×2 IMPLANT
DRAPE INCISE IOBAN 66X45 STRL (DRAPES) ×2 IMPLANT
DRAPE MICROSCOPE LEICA 54X105 (DRAPES) ×2 IMPLANT
DRAPE UTILITY XL STRL (DRAPES) ×2 IMPLANT
DRSG TEGADERM 2-3/8X2-3/4 SM (GAUZE/BANDAGES/DRESSINGS) ×6 IMPLANT
ELECT COATED BLADE 2.86 ST (ELECTRODE) ×2 IMPLANT
ELECT EMG 18 NIMS (NEUROSURGERY SUPPLIES) ×2
ELECTRODE EMG 18 NIMS (NEUROSURGERY SUPPLIES) ×1 IMPLANT
FORCEPS BIPOLAR SPETZLER 8 1.0 (NEUROSURGERY SUPPLIES) ×4 IMPLANT
GAUZE 4X4 16PLY RFD (DISPOSABLE) ×2 IMPLANT
GAUZE SPONGE 4X4 12PLY STRL (GAUZE/BANDAGES/DRESSINGS) ×4 IMPLANT
GENERATOR PULSE INSPIRE (Generator) ×2 IMPLANT
GLOVE BIO SURGEON STRL SZ 6.5 (GLOVE) IMPLANT
GLOVE BIOGEL M 7.0 STRL (GLOVE) ×4 IMPLANT
GOWN STRL REUS W/ TWL LRG LVL3 (GOWN DISPOSABLE) ×2 IMPLANT
GOWN STRL REUS W/TWL LRG LVL3 (GOWN DISPOSABLE) ×2
KIT BASIN OR (CUSTOM PROCEDURE TRAY) ×2 IMPLANT
KIT NEUROSTIMULATOR ACCESSORY (KITS) ×2 IMPLANT
KIT TURNOVER KIT B (KITS) ×2 IMPLANT
LEAD SENSING RESP INSPIRE (Lead) ×2 IMPLANT
LEAD SLEEP STIMULATION INSPIRE (Lead) ×2 IMPLANT
LOOP VESSEL MAXI BLUE (MISCELLANEOUS) ×2 IMPLANT
LOOP VESSEL MINI RED (MISCELLANEOUS) IMPLANT
MARKER SKIN DUAL TIP RULER LAB (MISCELLANEOUS) ×4 IMPLANT
NEEDLE HYPO 25GX1X1/2 BEV (NEEDLE) ×2 IMPLANT
NS IRRIG 1000ML POUR BTL (IV SOLUTION) ×2 IMPLANT
PAD ARMBOARD 7.5X6 YLW CONV (MISCELLANEOUS) ×2 IMPLANT
PASSER CATH 38CM DISP (INSTRUMENTS) ×2 IMPLANT
PENCIL SMOKE EVACUATOR (MISCELLANEOUS) ×4 IMPLANT
POSITIONER HEAD DONUT 9IN (MISCELLANEOUS) ×2 IMPLANT
PROBE NERVE STIMULATOR (NEUROSURGERY SUPPLIES) ×2 IMPLANT
REMOTE CONTROL SLEEP INSPIRE (MISCELLANEOUS) ×2 IMPLANT
SET WALTER ACTIVATION W/DRAPE (SET/KITS/TRAYS/PACK) ×2 IMPLANT
SLING ARM FOAM STRAP LRG (SOFTGOODS) IMPLANT
SLING ARM FOAM STRAP MED (SOFTGOODS) IMPLANT
SLING ARM IMMOBILIZER LRG (SOFTGOODS) ×2 IMPLANT
SPONGE INTESTINAL PEANUT (DISPOSABLE) ×2 IMPLANT
STAPLER VISISTAT 35W (STAPLE) ×2 IMPLANT
SUT SILK 2 0 SH (SUTURE) ×4 IMPLANT
SUT SILK 3 0 RB1 (SUTURE) ×4 IMPLANT
SUT SILK 3 0 REEL (SUTURE) ×2 IMPLANT
SUT SILK 3 0 SH 30 (SUTURE) ×4 IMPLANT
SUT SILK 3-0 (SUTURE) ×2
SUT SILK 3-0 RB1 30XBRD (SUTURE) ×2
SUT VIC AB 4-0 PS2 27 (SUTURE) ×4 IMPLANT
SUT VIC AB 5-0 P-3 18XBRD (SUTURE) ×2 IMPLANT
SUT VIC AB 5-0 P3 18 (SUTURE) ×2
SUTURE SILK 3-0 RB1 30XBRD (SUTURE) ×2 IMPLANT
SYR 10ML LL (SYRINGE) ×2 IMPLANT
TAPE CLOTH 4X10 WHT NS (GAUZE/BANDAGES/DRESSINGS) ×4 IMPLANT
TAPE CLOTH SURG 4X10 WHT LF (GAUZE/BANDAGES/DRESSINGS) IMPLANT
TOWEL GREEN STERILE (TOWEL DISPOSABLE) ×2 IMPLANT
TRAY ENT MC OR (CUSTOM PROCEDURE TRAY) ×2 IMPLANT
TUBING CONNECTING 10 (TUBING) ×2 IMPLANT

## 2021-01-14 NOTE — Anesthesia Postprocedure Evaluation (Signed)
Anesthesia Post Note  Patient: Jenna Baird  Procedure(s) Performed: IMPLANTATION OF HYPOGLOSSAL NERVE STIMULATOR  insertion of chest wall respirator sensor electrode electronic analysis of Implanted neurostimulator (Right Neck)     Patient location during evaluation: PACU Anesthesia Type: General Level of consciousness: awake and sedated Pain management: pain level controlled Vital Signs Assessment: post-procedure vital signs reviewed and stable Respiratory status: spontaneous breathing Cardiovascular status: stable Postop Assessment: no apparent nausea or vomiting Anesthetic complications: no   No complications documented.  Last Vitals:  Vitals:   01/14/21 1215 01/14/21 1230  BP: 139/79 122/76  Pulse: 86 86  Resp: 18 10  Temp:    SpO2: 92% 94%    Last Pain:  Vitals:   01/14/21 1242  PainSc: 8                  John F [address]

## 2021-01-14 NOTE — Transfer of Care (Signed)
Immediate Anesthesia Transfer of Care Note  Patient: Jenna Baird  Procedure(s) Performed: IMPLANTATION OF HYPOGLOSSAL NERVE STIMULATOR  insertion of chest wall respirator sensor electrode electronic analysis of Implanted neurostimulator (Right Neck)  Patient Location: PACU  Anesthesia Type:General  Level of Consciousness: drowsy  Airway & Oxygen Therapy: Patient Spontanous Breathing and Patient connected to nasal cannula oxygen  Post-op Assessment: Report given to RN and Post -op Vital signs reviewed and stable  Post vital signs: Reviewed and stable  Last Vitals:  Vitals Value Taken Time  BP 132/77 01/14/21 1057  Temp    Pulse 89 01/14/21 1059  Resp 17 01/14/21 1059  SpO2 94 % 01/14/21 1059  Vitals shown include unvalidated device data.  Last Pain:  Vitals:   01/14/21 0600  PainSc: 0-No pain         Complications: No complications documented.

## 2021-01-14 NOTE — Anesthesia Procedure Notes (Signed)
Procedure Name: Intubation Date/Time: 01/14/2021 7:50 AM Performed by: Waynard Edwards, CRNA Pre-anesthesia Checklist: Patient identified, Emergency Drugs available, Suction available and Patient being monitored Patient Re-evaluated:Patient Re-evaluated prior to induction Oxygen Delivery Method: Circle system utilized Preoxygenation: Pre-oxygenation with 100% oxygen Induction Type: IV induction Ventilation: Mask ventilation without difficulty Laryngoscope Size: Miller and 2 Grade View: Grade I Tube type: Oral Tube size: 7.0 mm Number of attempts: 1 Airway Equipment and Method: Stylet Placement Confirmation: ETT inserted through vocal cords under direct vision,  positive ETCO2 and breath sounds checked- equal and bilateral Secured at: 22 cm Tube secured with: Tape Dental Injury: Teeth and Oropharynx as per pre-operative assessment

## 2021-01-14 NOTE — H&P (Signed)
Jenna Baird is an 55 y.o. female.   Chief Complaint: Obstructive Sleep Apnea HPI: Hx of OSA, unable to tol CPAP  Past Medical History:  Diagnosis Date   Asthma    Dyspnea    with activities   Family history of adverse reaction to anesthesia    mother with sensitivity to anesthesia   GERD (gastroesophageal reflux disease)    Pneumonia    with COVID   Psoriatic arthritis (HCC)    Sleep apnea    does not use CPAP    Past Surgical History:  Procedure Laterality Date   ABDOMINAL HYSTERECTOMY     CHOLECYSTECTOMY     COLONOSCOPY     DIAGNOSTIC LAPAROSCOPY     DRUG INDUCED ENDOSCOPY N/A 08/23/2020   Procedure: DRUG INDUCED ENDOSCOPY;  Surgeon: Osborn Coho, MD;  Location: [facility];  Service: ENT;  Laterality: N/A;    History reviewed. No pertinent family history. Social History:  reports that she has never smoked. She has never used smokeless tobacco. She reports current alcohol use. She reports that she does not use drugs.  Allergies:  Allergies  Allergen Reactions   Vancomycin Hives    Medications Prior to Admission  Medication Sig Dispense Refill   cholecalciferol (VITAMIN D3) 25 MCG (1000 UNIT) tablet Take 1,000 Units by mouth daily.     esomeprazole (NEXIUM) 20 MG capsule Take 20 mg by mouth daily.     etanercept (ENBREL SURECLICK) 50 MG/ML injection Inject 50 mg into the skin once a week.     levalbuterol (XOPENEX HFA) 45 MCG/ACT inhaler Inhale 1 puff into the lungs every 6 (six) hours as needed for wheezing.     loratadine (CLARITIN) 10 MG tablet Take 10 mg by mouth daily.     montelukast (SINGULAIR) 10 MG tablet Take 10 mg by mouth at bedtime.      Results for orders placed or performed during the hospital encounter of 01/12/21 (from the past 48 hour(s))  CBC per protocol     Status: Abnormal   Collection Time: 01/12/21  3:24 PM  Result Value Ref Range   WBC 8.7 4.0 - 10.5 K/uL   RBC 5.37 (H) 3.87 - 5.11 MIL/uL    Hemoglobin 13.3 12.0 - 15.0 g/dL   HCT 94.1 74.0 - 81.4 %   MCV 80.6 80.0 - 100.0 fL   MCH 24.8 (L) 26.0 - 34.0 pg   MCHC 30.7 30.0 - 36.0 g/dL   RDW 48.1 85.6 - 31.4 %   Platelets 327 150 - 400 K/uL   nRBC 0.0 0.0 - 0.2 %    Comment: Performed at [facility], [address], [place], [place] [address]  SARS CORONAVIRUS 2 (TAT 6-24 HRS) Nasopharyngeal Nasopharyngeal Swab     Status: None   Collection Time: 01/12/21  3:26 PM   Specimen: Nasopharyngeal Swab  Result Value Ref Range   SARS Coronavirus 2 NEGATIVE NEGATIVE    Comment: (NOTE) SARS-CoV-2 target nucleic acids are NOT DETECTED.  The SARS-CoV-2 RNA is generally detectable in upper and lower respiratory specimens during the acute phase of infection. Negative results do not preclude SARS-CoV-2 infection, do not rule out co-infections with other pathogens, and should not be used as the sole basis for treatment or other patient management decisions. Negative results must be combined with clinical observations, patient history, and epidemiological information. The expected result is Negative.  Fact Sheet for Patients: HairSlick.no  Fact Sheet for Healthcare Providers: quierodirigir.com  This test is  not yet approved or cleared by the [place] and  has been authorized for detection and/or diagnosis of SARS-CoV-2 by FDA under an Emergency Use Authorization (EUA). This EUA will remain  in effect (meaning this test can be used) for the duration of the COVID-19 declaration under Se ction 564(b)(1) of the Act, 21 U.S.C. section 360bbb-3(b)(1), unless the authorization is terminated or revoked sooner.  Performed at [facility], [address] [place], [place] [address]    No results found.  Review of Systems  Respiratory: Positive for apnea.     Blood pressure (!) 147/76, pulse 70, temperature 98.3 F (36.8 C), resp. rate 18, height 5\' 5"  (1.651  m), weight 84.4 kg, SpO2 99 %. Physical Exam Cardiovascular:     Rate and Rhythm: Normal rate.  Pulmonary:     Effort: Pulmonary effort is normal.  Musculoskeletal:     Cervical back: Normal range of motion.  Neurological:     Mental Status: She is alert.      Assessment/Plan Adm for OP Inspire Implant  , MD 01/14/2021, 7:33 AM

## 2021-01-14 NOTE — Op Note (Signed)
Operative Note: INSPIRE IMPLANT  Patient: Jenna Baird  Medical record number: [id number]  Date:01/14/2021  Pre-operative Indications: Moderate/severe obstructive sleep apnea with positive airway pressure intolerance  Postoperative Indications: Same  Surgical Procedure:  1.  12th cranial nerve (hypoglossal) stimulation implant    2.  Placement of chest wall respiratory sensor    3.  Electronic analysis of implanted neurostimulator with pulse generator system  Anesthesia: GET  Surgeon: Barbee Cough, M.D.  Assist: Scot Jun, PA  Mr. Spainhour, PA assistance was required throughout the surgical procedure including surgical planning, retraction, management of bleeding and surgical decision-making throughout the operation.  Complications: None  EBL: 100 cc   Brief History: The patient is a 55 y.o. female with a history of moderate/severe obstructive sleep apnea.  The patient has undergone work-up including sleep study and trial of CPAP which they did not tolerate.  Patient is unable to use long-term CPAP for control of obstructive sleep apnea.  Patient underwent DISE which showed anterior to posterior airway obstruction, considered a good candidate for Inspire Implant. Given the patient's history and findings, I recommended Inspire Implant under general anesthesia, risks and benefits were discussed in detail with the patient and family. They understand and agree with our plan for surgery which is scheduled at [facility] on an elective basis.  Surgical Procedure: The patient is brought to the operating room on 01/14/2021 and placed in supine position on the operating table. General endotracheal anesthesia was established without difficulty. When the patient was adequately anesthetized, surgical timeout was performed and correct identification of the patient and the surgical procedure. The patient was injected with 7 cc of 1% lidocaine 1:100,000 dilution epinephrine in  subcutaneous fashion in the proposed skin incisions.  The patient was positioned and prepped and draped in sterile fashion.  The NIMS monitoring system was positioned and electrodes were placed in the anterior floor of mouth and tongue to assess the genioglossus and styloglossus muscle groups.  Nerve monitoring was used throughout the surgical procedure.  The procedure was begun by creating a modified right submandibular incision in the upper anterior lateral neck ~2 cm below the mandible and in a natural skin crease.  The incision was carried through the skin and underlying subcutaneous tissue to the level of the platysma.  Platysma muscle was divided and subplatysmal flaps were elevated superiorly and inferiorly.  The submandibular space was entered and the submandibular gland was identified and retracted posteriorly.  The digastric tendon was identified and dissection was carried out anterior and posterior along the tendon and muscle bellies.  The mylohyoid muscle was identified and retracted anteriorly and the hypoglossal nerve was carefully dissected across the anterior floor of the submandibular space.  Lateral branches to the retrusor muscles were identified and tested intraoperatively using the NIMS stimulator.  Stimulation electrode cuff for the hypoglossal nerve stimulator was placed distal to these branches on the medial hypoglossal nerve branch innervating the genioglossus muscle.  Diagnostic evaluation confirmed activation of the genioglossus muscle, resulting in genioglossal activation and tongue protrusion which was visually confirmed in the operating room.  The stimulation electrode was  sutured to the digastric tendon with interrupted 4-0 silk sutures.  A second second incision was created in the anterior upper right chest wall overlying the second rib interspace.  Incision was carried through the skin and underlying subcutaneous tissue to the level of the pectoralis major muscle.  The IPG  pocket was created deep to the subcutaneous layer and superficial to the pectoralis  muscle.  Placement of the stimulation lead was then undertaken by gentle dissection through the pectoralis major muscle parallel to the muscle fibers overlying the second rib interspace.  The subpectoral fat plane was then divided bluntly and the medial border of the external intercostal muscle was identified.  1 cm posterior to the medial border, dissection was carried through the external intercostal and a plane was developed in the second rib interspace.  The sensor lead was then tunneled into the second rib interspace and sutured with 3-0 silk suture to secure it in proper orientation with the sensing probe facing the pleural space.  The pectoralis major muscle dissection was then brought back into its normal anatomic position and the sense lead was brought out into the subcutaneous pocket.  The lead for the stimulating electrode was then tunneled in a subplatysmal fashion from the submandibular incision to the anterior chest wall incision.  The stimulating electrode and respiration sensing lead were connected to the implantable pulse generator.  Diagnostic evaluation was run confirming respiratory signal and good tongue protrusion on stimulation.  The implantable pulse generator was then placed in the subclavicular pocket and sutured to the pectoralis fascia with 2-0 silk sutures sutures.  The incisions were thoroughly irrigated with bacitracin saline irrigation.  The incisions were then closed in multiple layers with 4-0 and 5-0 Vicryl interrupted sutures in the deep and superficial subcutaneous levels at each incision site.  Dermabond surgical glue was used for skin closure.  The patient's incisions were dressed with rolled gauze and Hypafix tape for pressure dressing.    An orogastric tube was passed and stomach contents were aspirated. Patient was awakened from anesthetic and transferred from the operating room to the  recovery room in stable condition. There were no complications and blood loss was minimal.  X-rays were obtained in the recovery room to assess the proper location of the pulse generator, sensor lead and stimulation lead.   Barbee Cough, M.D. [facility] 01/14/2021

## 2021-01-18 ENCOUNTER — Encounter (HOSPITAL_COMMUNITY): Payer: Self-pay | Admitting: Otolaryngology

## 2021-07-16 IMAGING — DX DG CHEST 1V PORT
1 series · 1 of 1 positions shown · non-contrast
Comparison: None.

CLINICAL DATA: Postoperative assessment. Hypoglossal stimulator
present

EXAM:
PORTABLE CHEST 1 VIEW

[chest ap]
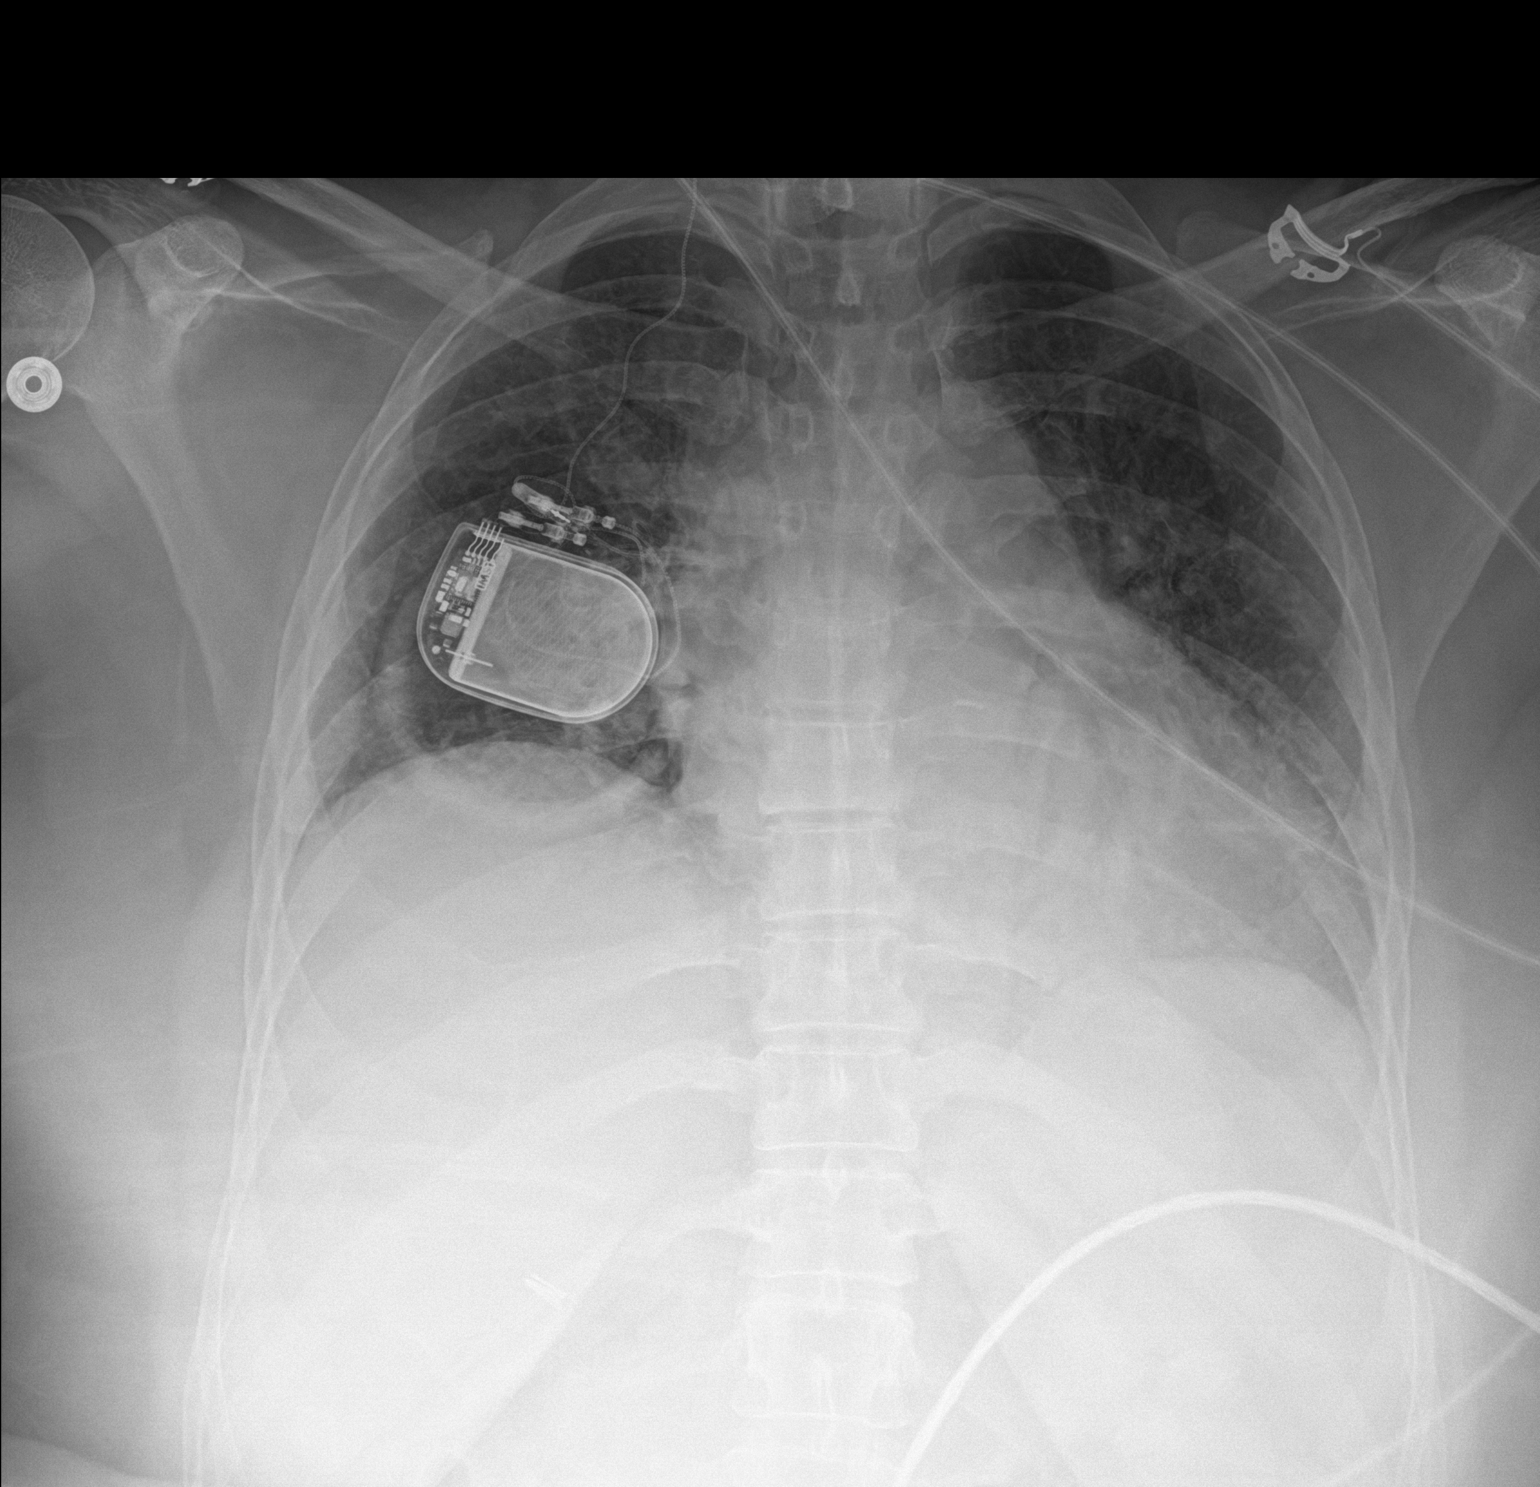

[1 of 1 positions shown; findings below may reference images not displayed]

FINDINGS: A portion of the right lung is obscured by the stimulator device.
Visualized lungs clear. Heart is mildly enlarged with pulmonary
vascularity normal. No adenopathy. No bone lesions
IMPRESSION: Limited evaluation of right lung due to stimulator device present.
Visualized lungs clear. Heart mildly enlarged. No adenopathy
appreciable.

## 2021-07-16 IMAGING — DX DG NECK SOFT TISSUE
2 series · 2 of 2 positions shown · non-contrast
Comparison: None.

CLINICAL DATA: Hypoglossal stimulator placement

EXAM:
NECK SOFT TISSUES - 1+ VIEW

[neck ap]
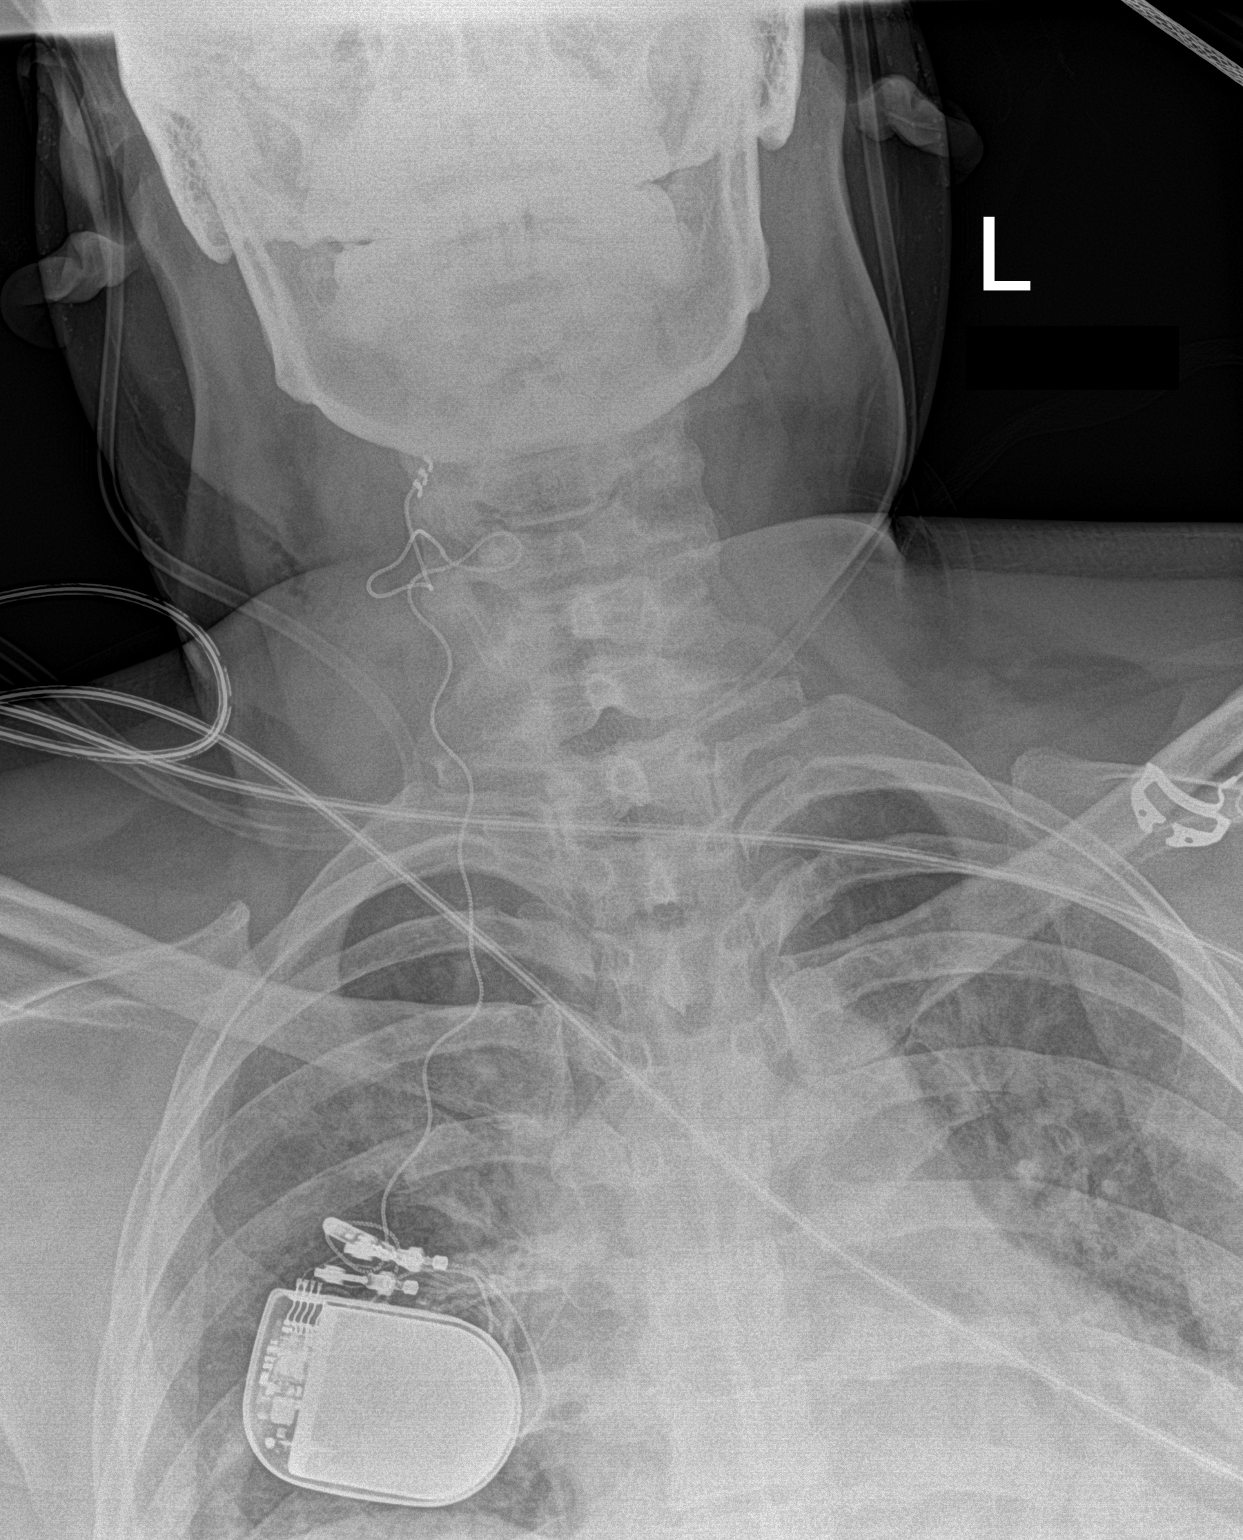

[neck lat]
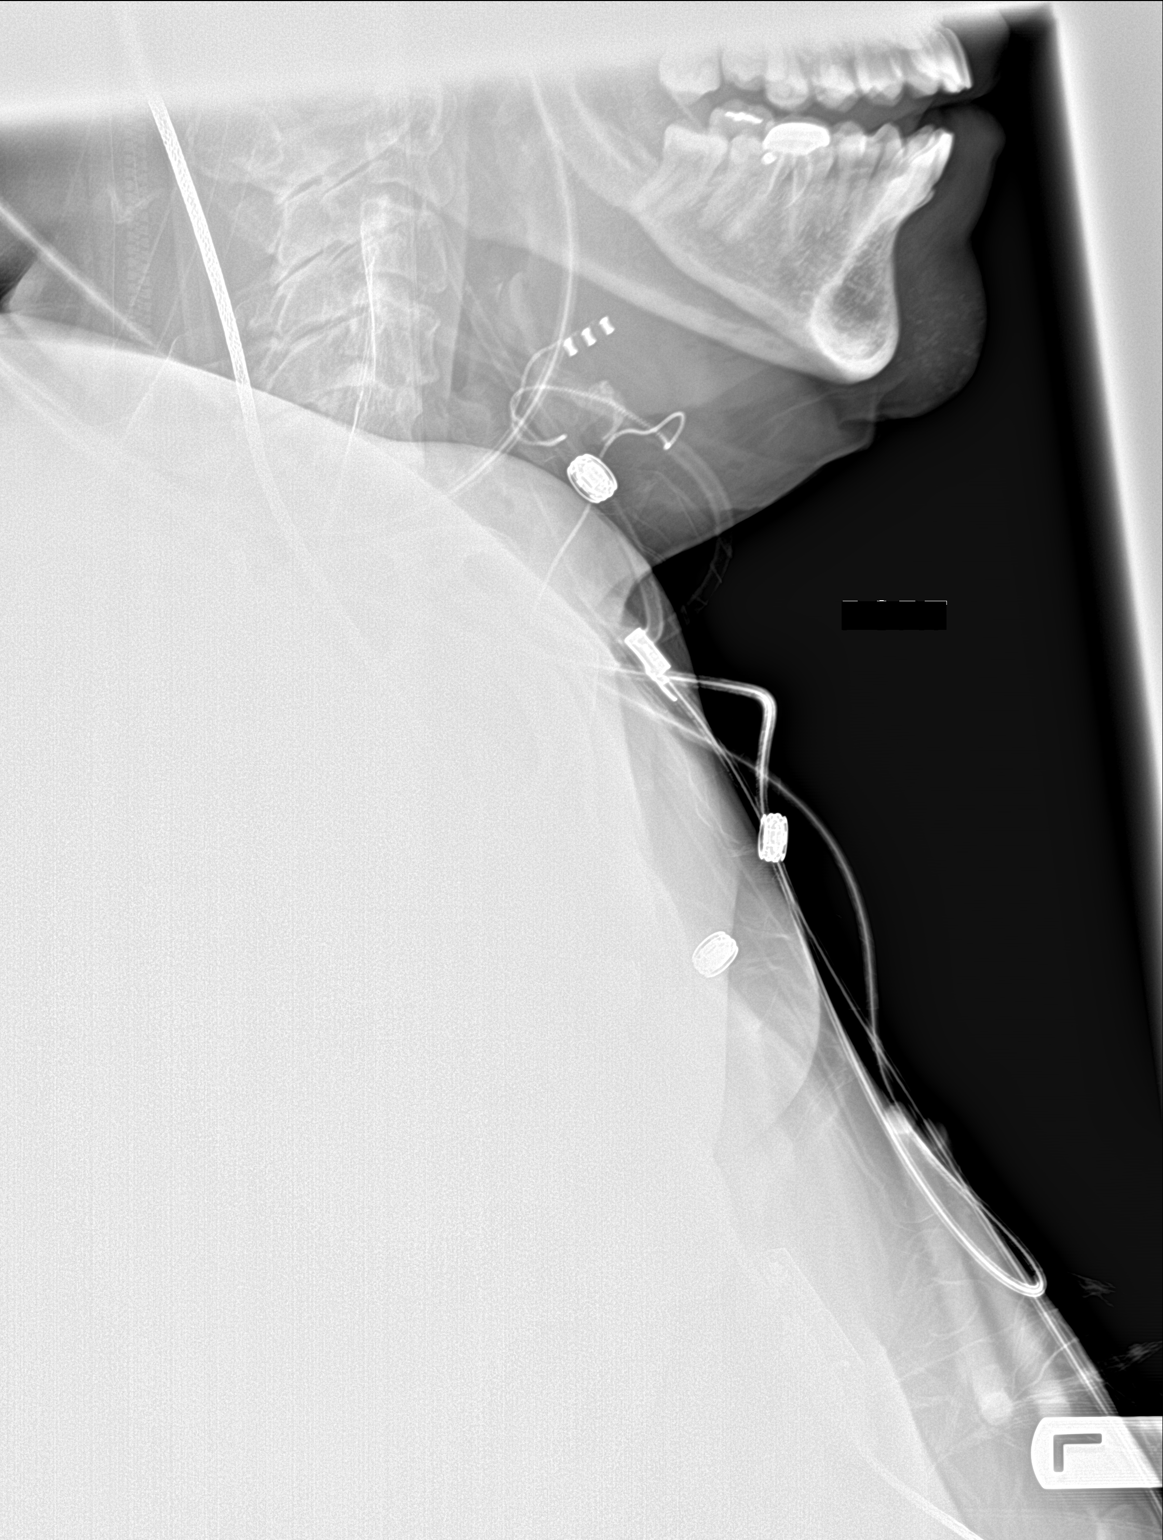

[2 of 2 positions shown; findings below may reference images not displayed]

FINDINGS: Frontal and lateral views obtained. Hypoglossal stimulator present
on the right in the tongue base region. Leads appear intact.
Epiglottis and aryepiglottic folds appear normal. Prevertebral soft
tissues are normal. No edema in the tongue base region. Visualized
trachea appears unremarkable. Visualized upper lung regions are
clear. No bony abnormality. Rudimentary cervical ribs bilaterally.
IMPRESSION: Hypoglossal stimulator lead tips in right tongue base region. Leads
appear intact. No soft tissue or bony abnormality. Visualized lungs
clear. Incidental note is made of rudimentary cervical ribs.
